# Patient Record
Sex: Male | Born: 1990 | Race: White | Hispanic: No | Marital: Single | Smoking: Current every day smoker
Health system: Southern US, Community
[De-identification: ages and names within clinical notes are randomized; demographics above are authoritative.]

---
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
## 2011-02-11 ENCOUNTER — Ambulatory Visit: Payer: Self-pay | Admitting: Internal Medicine

## 2011-08-18 ENCOUNTER — Ambulatory Visit (INDEPENDENT_AMBULATORY_CARE_PROVIDER_SITE_OTHER): Payer: Managed Care, Other (non HMO) | Admitting: Family Medicine

## 2011-08-18 VITALS — BP 144/82 | HR 61 | Temp 98.1°F | Resp 16 | Ht 70.5 in | Wt 193.4 lb

## 2011-08-18 DIAGNOSIS — G43809 Other migraine, not intractable, without status migrainosus: Secondary | ICD-10-CM

## 2011-08-18 DIAGNOSIS — G44009 Cluster headache syndrome, unspecified, not intractable: Secondary | ICD-10-CM

## 2011-08-18 DIAGNOSIS — J3089 Other allergic rhinitis: Secondary | ICD-10-CM

## 2011-08-18 MED ORDER — PROPRANOLOL HCL ER 60 MG PO CP24: 60.0000 mg | ORAL_CAPSULE | Freq: Every day | ORAL | Status: AC

## 2011-08-18 MED ORDER — CYCLOBENZAPRINE HCL 10 MG PO TABS: 10.0000 mg | ORAL_TABLET | Freq: Every evening | ORAL | Status: AC | PRN

## 2011-08-18 NOTE — Progress Notes (Signed)
  Subjective:    Patient ID: Tommy Gallagher, male    DOB: 1991-01-19, 21 y.o.   MRN: [id number]  HPI  Patient presents complaining of headaches over the past year. He has seen multiple specialists however continues to experience almost daily headaches.  Patient states he wakes up pain free but within 15 minutes after waking up he develops a throbbing sensation behind his (L) eye. Patient also experiences tightness of his neck muscles or pressure across his forehead.  Typically the headaches last between 45 minutes to 3 hours though can reoccur later in the day.  Denies nausea or vomiting. Denies photosensitivity Some phonophobia Sleep always helps  Seen initially by optometrist([facility]) and treated for a stye; later glasses prescribed.  Seen subsequently by opthomologist([facility]) extensive evaluation to include CT of the brain was done. All test were normal.   Obtained second opinion with Dr. Emily Filbert 2 weeks ago and repeat exam was normal. Evaluated by oral surgery and placed on Robaxin and Relafen.    FH/ migraine headaches(Mother)  Has also tried sudafed the last several day.  Mucus more productive of clear sputum  Smoker 1ppd  Review of Systems     Objective:   Physical Exam  Constitutional: He appears well-developed.  HENT:  Right Ear: External ear normal.  Left Ear: External ear normal.  Nose: Nose normal.  Eyes: Conjunctivae and EOM are normal. Pupils are equal, round, and reactive to light.  Neck: Normal range of motion. Neck supple. No thyromegaly present.  Cardiovascular: Normal rate, regular rhythm and normal heart sounds.   Pulmonary/Chest: Effort normal and breath sounds normal.  Abdominal: Soft. Bowel sounds are normal.  Musculoskeletal: Normal range of motion.  Neurological: He is alert. He has normal strength. He displays normal reflexes. No cranial nerve deficit or sensory deficit. He exhibits normal muscle tone.  Reflex Scores:  Bicep reflexes are 2+ on the right side and 2+ on the left side.      Patellar reflexes are 2+ on the right side and 2+ on the left side. Skin: Skin is warm.  Psychiatric: He has a normal mood and affect.          Assessment & Plan:   1. Cluster headaches  cyclobenzaprine (FLEXERIL) 10 MG tablet, propranolol ER (INDERAL LA) 60 MG 24 hr capsule, Ambulatory referral to Neurology   Headache diary Anticipatory guidance

## 2011-08-22 ENCOUNTER — Ambulatory Visit: Payer: Self-pay | Admitting: Internal Medicine

## 2011-08-26 ENCOUNTER — Ambulatory Visit: Payer: Self-pay | Admitting: Family Medicine

## 2011-08-26 DIAGNOSIS — Z0289 Encounter for other administrative examinations: Secondary | ICD-10-CM

## 2011-08-27 ENCOUNTER — Other Ambulatory Visit: Payer: Self-pay | Admitting: Neurology

## 2011-08-27 DIAGNOSIS — R51 Headache: Secondary | ICD-10-CM

## 2011-08-31 ENCOUNTER — Ambulatory Visit
Admission: RE | Admit: 2011-08-31 | Discharge: 2011-08-31 | Disposition: A | Discharge status: Home / Self Care | Payer: Self-pay | Source: Ambulatory Visit | Attending: Neurology | Admitting: Neurology

## 2011-08-31 DIAGNOSIS — R51 Headache: Secondary | ICD-10-CM

## 2011-09-02 ENCOUNTER — Ambulatory Visit: Payer: Self-pay | Admitting: Family Medicine

## 2011-09-30 ENCOUNTER — Other Ambulatory Visit: Payer: Self-pay | Admitting: Neurology

## 2011-09-30 DIAGNOSIS — H02402 Unspecified ptosis of left eyelid: Secondary | ICD-10-CM

## 2011-10-04 ENCOUNTER — Ambulatory Visit
Admission: RE | Admit: 2011-10-04 | Discharge: 2011-10-04 | Disposition: A | Discharge status: Home / Self Care | Payer: Managed Care, Other (non HMO) | Source: Ambulatory Visit | Attending: Neurology | Admitting: Neurology

## 2011-10-04 DIAGNOSIS — H02402 Unspecified ptosis of left eyelid: Secondary | ICD-10-CM

## 2015-07-05 ENCOUNTER — Emergency Department (HOSPITAL_COMMUNITY)
Admission: EM | Admit: 2015-07-05 | Discharge: 2015-07-05 | Disposition: A | Discharge status: Home / Self Care | Payer: Managed Care, Other (non HMO) | Attending: Emergency Medicine | Admitting: Emergency Medicine

## 2015-07-05 ENCOUNTER — Encounter (HOSPITAL_COMMUNITY): Payer: Self-pay | Admitting: Emergency Medicine

## 2015-07-05 DIAGNOSIS — L02414 Cutaneous abscess of left upper limb: Secondary | ICD-10-CM | POA: Insufficient documentation

## 2015-07-05 DIAGNOSIS — F17210 Nicotine dependence, cigarettes, uncomplicated: Secondary | ICD-10-CM | POA: Insufficient documentation

## 2015-07-05 DIAGNOSIS — Z79899 Other long term (current) drug therapy: Secondary | ICD-10-CM | POA: Insufficient documentation

## 2015-07-05 DIAGNOSIS — L0291 Cutaneous abscess, unspecified: Secondary | ICD-10-CM

## 2015-07-05 DIAGNOSIS — Z791 Long term (current) use of non-steroidal anti-inflammatories (NSAID): Secondary | ICD-10-CM | POA: Insufficient documentation

## 2015-07-05 MED ORDER — LIDOCAINE HCL (PF) 1 % IJ SOLN
5.0000 mL | Freq: Once | INTRAMUSCULAR | Status: AC
  Administered 2015-07-05: 5 mL via INTRADERMAL
  Filled 2015-07-05: qty 5

## 2015-07-05 MED ORDER — SULFAMETHOXAZOLE-TRIMETHOPRIM 800-160 MG PO TABS: 1.0000 | ORAL_TABLET | Freq: Two times a day (BID) | ORAL | Status: AC

## 2015-07-05 NOTE — ED Notes (Signed)
Pt. reports reports pain/swelling at left antecubital skin abscess with no drainage  onset this week , pt. stated he has been using it for IV Heroin . Denies fever or chills.

## 2015-07-05 NOTE — Discharge Instructions (Signed)
Take the prescribed medication as directed.  Recommend warm compresses at home to help aid healing.  Return to the ED for new or worsening symptoms.   Abscess An abscess is an infected area that contains a collection of pus and debris.It can occur in almost any part of the body. An abscess is also known as a furuncle or boil. CAUSES  An abscess occurs when tissue gets infected. This can occur from blockage of oil or sweat glands, infection of hair follicles, or a minor injury to the skin. As the body tries to fight the infection, pus collects in the area and creates pressure under the skin. This pressure causes pain. People with weakened immune systems have difficulty fighting infections and get certain abscesses more often.  SYMPTOMS Usually an abscess develops on the skin and becomes a painful mass that is red, warm, and tender. If the abscess forms under the skin, you may feel a moveable soft area under the skin. Some abscesses break open (rupture) on their own, but most will continue to get worse without care. The infection can spread deeper into the body and eventually into the bloodstream, causing you to feel ill.  DIAGNOSIS  Your caregiver will take your medical history and perform a physical exam. A sample of fluid may also be taken from the abscess to determine what is causing your infection. TREATMENT  Your caregiver may prescribe antibiotic medicines to fight the infection. However, taking antibiotics alone usually does not cure an abscess. Your caregiver may need to make a small cut (incision) in the abscess to drain the pus. In some cases, gauze is packed into the abscess to reduce pain and to continue draining the area. HOME CARE INSTRUCTIONS   Only take over-the-counter or prescription medicines for pain, discomfort, or fever as directed by your caregiver.  If you were prescribed antibiotics, take them as directed. Finish them even if you start to feel better.  If gauze is used,  follow your caregiver's directions for changing the gauze.  To avoid spreading the infection:  Keep your draining abscess covered with a bandage.  Wash your hands well.  Do not share personal care items, towels, or whirlpools with others.  Avoid skin contact with others.  Keep your skin and clothes clean around the abscess.  Keep all follow-up appointments as directed by your caregiver. SEEK MEDICAL CARE IF:   You have increased pain, swelling, redness, fluid drainage, or bleeding.  You have muscle aches, chills, or a general ill feeling.  You have a fever. MAKE SURE YOU:   Understand these instructions.  Will watch your condition.  Will get help right away if you are not doing well or get worse.   This information is not intended to replace advice given to you by your health care provider. Make sure you discuss any questions you have with your health care provider.   Document Released: 01/23/2005 Document Revised: 10/15/2011 Document Reviewed: 06/28/2011 Elsevier Interactive Patient Education [employer]2016 [employer]

## 2015-07-05 NOTE — ED Provider Notes (Signed)
CSN: [id number]     Arrival date & time 07/05/15  0536 History   First MD Initiated Contact with Patient 07/05/15 [phone]     Chief Complaint  Patient presents with  . Abscess     (Consider location/radiation/quality/duration/timing/severity/associated sxs/prior Treatment) Patient is a 25 y.o. male presenting with abscess. The history is provided by the patient and medical records.  Abscess   25 year old male here with small abscess to his left [facility] which first appeared approximately 5 days ago after injecting IV heroin. 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 He states area was painful at first, but then seemed to "clear up" but returned again yesterday. He states area is tender to touch. He denies any diffuse pain of his elbow. Denies fever or chills. Patient denies history of HIV, diabetes, or MRSA. He does have a known allergy to Keflex, he gets a rash with this.  History reviewed. No pertinent past medical history. History reviewed. No pertinent past surgical history. No family history on file. Social History  Substance Use Topics  . Smoking status: Current Every Day Smoker -- 0.00 packs/day for 0 years    Types: Cigarettes  . Smokeless tobacco: None  . Alcohol Use: None    Review of Systems  Skin:       abscess  All other systems reviewed and are negative.     Allergies  Review of patient's allergies indicates no known allergies.  Home Medications   Prior to Admission medications   Medication Sig Start Date End Date Taking? Authorizing Provider  methocarbamol (ROBAXIN) 750 MG tablet Take 750 mg by mouth 2 (two) times daily.    Historical Provider, MD  nabumetone (RELAFEN) 750 MG tablet Take 750 mg by mouth 2 (two) times daily.    Historical Provider, MD  propranolol ER (INDERAL LA) 60 MG 24 hr capsule Take 1 capsule (60 mg total) by mouth daily. 08/18/11 08/17/12  Dois Davenport, MD   BP 140/84 mmHg  Pulse 103  Temp(Src) 99.1 F  (37.3 C) (Oral)  Resp 16  Ht 6' (1.829 m)  Wt 87.091 kg  BMI 26.03 kg/m2  SpO2 98%   Physical Exam  Constitutional: He is oriented to person, place, and time. He appears well-developed and well-nourished. No distress.  HENT:  Head: Normocephalic and atraumatic.  Mouth/Throat: Oropharynx is clear and moist.  Eyes: Conjunctivae and EOM are normal. Pupils are equal, round, and reactive to light.  Neck: Normal range of motion. Neck supple.  Cardiovascular: Normal rate, regular rhythm and normal heart sounds.   Pulmonary/Chest: Effort normal and breath sounds normal. No respiratory distress. He has no wheezes.  Abdominal: Soft. Bowel sounds are normal. There is no tenderness. There is no guarding.  Musculoskeletal: Normal range of motion.  Small, 2cm in diameter abscess of left AC; area is firm without fluctuance; no surrounding induration or cellulitis; full ROM of left elbow; normal strength and sensation throughout left arm  Neurological: He is alert and oriented to person, place, and time.  Skin: Skin is warm and dry. He is not diaphoretic.  Psychiatric: He has a normal mood and affect.  Nursing note and vitals reviewed.   ED Course  Procedures (including critical care time) Labs Review Labs Reviewed - No data to display  Imaging Review No results found. I have personally reviewed and evaluated these images and lab results as part of my medical decision-making.   EKG Interpretation None  MDM   Final diagnoses:  Abscess   25 year old male here with abscess of his left AC after injecting heroin earlier this week.  Patient is afebrile, nontoxic. Abscess is small and firm to touch without fluctuance or drainage and does not appear amenable to I&D at this time. There are no clinical signs or symptoms concerning for septic joint at this time. Will start on bactrim.  Encouraged warm compresses at home.  Discussed plan with patient, he/she acknowledged understanding and agreed  with plan of care.  Return precautions given for new or worsening symptoms.  Garlon HatchetLisa M Lulia Schriner, PA-C 07/05/15 [id number]  Rolland PorterMark James, MD 07/12/15 1500

## 2016-01-09 ENCOUNTER — Ambulatory Visit: Payer: Managed Care, Other (non HMO)

## 2016-01-13 ENCOUNTER — Ambulatory Visit: Payer: Managed Care, Other (non HMO)

## 2016-01-15 ENCOUNTER — Ambulatory Visit: Payer: Managed Care, Other (non HMO)

## 2018-01-31 ENCOUNTER — Encounter (HOSPITAL_BASED_OUTPATIENT_CLINIC_OR_DEPARTMENT_OTHER): Payer: Self-pay | Admitting: *Deleted

## 2018-01-31 ENCOUNTER — Other Ambulatory Visit: Payer: Self-pay

## 2018-01-31 ENCOUNTER — Emergency Department (HOSPITAL_BASED_OUTPATIENT_CLINIC_OR_DEPARTMENT_OTHER)
Admission: EM | Admit: 2018-01-31 | Discharge: 2018-02-01 | Disposition: A | Discharge status: Home / Self Care | Payer: Self-pay | Attending: Emergency Medicine | Admitting: Emergency Medicine

## 2018-01-31 DIAGNOSIS — Y999 Unspecified external cause status: Secondary | ICD-10-CM | POA: Insufficient documentation

## 2018-01-31 DIAGNOSIS — F17210 Nicotine dependence, cigarettes, uncomplicated: Secondary | ICD-10-CM | POA: Insufficient documentation

## 2018-01-31 DIAGNOSIS — W182XXA Fall in (into) shower or empty bathtub, initial encounter: Secondary | ICD-10-CM | POA: Insufficient documentation

## 2018-01-31 DIAGNOSIS — Y93E1 Activity, personal bathing and showering: Secondary | ICD-10-CM | POA: Insufficient documentation

## 2018-01-31 DIAGNOSIS — Z23 Encounter for immunization: Secondary | ICD-10-CM | POA: Insufficient documentation

## 2018-01-31 DIAGNOSIS — Y92002 Bathroom of unspecified non-institutional (private) residence single-family (private) house as the place of occurrence of the external cause: Secondary | ICD-10-CM | POA: Insufficient documentation

## 2018-01-31 DIAGNOSIS — S01121A Laceration with foreign body of right eyelid and periocular area, initial encounter: Secondary | ICD-10-CM | POA: Insufficient documentation

## 2018-01-31 DIAGNOSIS — S0181XA Laceration without foreign body of other part of head, initial encounter: Secondary | ICD-10-CM

## 2018-01-31 DIAGNOSIS — Z79899 Other long term (current) drug therapy: Secondary | ICD-10-CM | POA: Insufficient documentation

## 2018-01-31 NOTE — ED Triage Notes (Signed)
Pt reports he fell in [place] and hit his head on corner of counter. Denies LOC. Large lac present to forehead over right eyebrow

## 2018-02-01 ENCOUNTER — Emergency Department (HOSPITAL_BASED_OUTPATIENT_CLINIC_OR_DEPARTMENT_OTHER): Payer: Self-pay

## 2018-02-01 MED ORDER — TETANUS-DIPHTH-ACELL PERTUSSIS 5-2.5-18.5 LF-MCG/0.5 IM SUSP
0.5000 mL | Freq: Once | INTRAMUSCULAR | Status: AC
  Administered 2018-02-01: 0.5 mL via INTRAMUSCULAR
  Filled 2018-02-01: qty 0.5

## 2018-02-01 MED ORDER — LIDOCAINE-EPINEPHRINE (PF) 1 %-1:200000 IJ SOLN
INTRAMUSCULAR | Status: AC
  Administered 2018-02-01: 10 mL
  Filled 2018-02-01: qty 10

## 2018-02-01 MED ORDER — LIDOCAINE-EPINEPHRINE (PF) 2 %-1:200000 IJ SOLN
20.0000 mL | Freq: Once | INTRAMUSCULAR | Status: DC
  Filled 2018-02-01: qty 20

## 2018-02-01 NOTE — ED Provider Notes (Signed)
[facility] EMERGENCY DEPARTMENT Provider Note   CSN: [id number] Arrival date & time: 01/31/18  2324     History   Chief Complaint Chief Complaint  Patient presents with  . Head Injury    HPI Tommy Gallagher is a 27 y.o. male.  HPI  This is a 27 year old male who presents with a facial laceration.  Patient reports that he was getting out of the bathtub and slipped hitting his forehead on the sink.  He did not lose consciousness.  Denies any nausea or vomiting.  This happened at 9 PM.  He has been ambulatory.  He denies pain elsewhere.  Denies any headache or vision changes.  Unknown last tetanus shot.  Patient does report drug use tonight.  He states he used heroin.  History reviewed. No pertinent past medical history.  There are no active problems to display for this patient.   History reviewed. No pertinent surgical history.      Home Medications    Prior to Admission medications   Medication Sig Start Date End Date Taking? Authorizing Provider  methocarbamol (ROBAXIN) 750 MG tablet Take 750 mg by mouth 2 (two) times daily.    [provider]  nabumetone (RELAFEN) 750 MG tablet Take 750 mg by mouth 2 (two) times daily.    [provider]  propranolol ER (INDERAL LA) 60 MG 24 hr capsule Take 1 capsule (60 mg total) by mouth daily. 08/18/11 08/17/12  Dois Davenport, MD    Family History No family history on file.  Social History Social History   Tobacco Use  . Smoking status: Current Every Day Smoker    Packs/day: 0.00    Years: 0.00    Pack years: 0.00    Types: Cigarettes  . Smokeless tobacco: Never Used  Substance Use Topics  . Alcohol use: Not on file    Comment: occasional  . Drug use: Yes    Types: IV    Comment: heroin     Allergies   Patient has no known allergies.   Review of Systems Review of Systems  Gastrointestinal: Negative for nausea and vomiting.  Musculoskeletal: Negative for neck pain.  Skin:  Positive for wound.  Neurological: Negative for syncope and headaches.  All other systems reviewed and are negative.    Physical Exam Updated Vital Signs BP 117/75 (BP Location: Right Arm)   Pulse 73   Temp 98.4 F (36.9 C) (Oral)   Resp 19   Ht 1.829 m (6')   Wt 83.9 kg   SpO2 100%   BMI 25.09 kg/m   Physical Exam  Constitutional: He is oriented to person, place, and time. He appears well-developed and well-nourished.  Appears high  HENT:  Head: Normocephalic and atraumatic.    T shaped laceration just above the right eyebrow, deep, not bleeding, no involvement of muscle, approximately 6 cm x 2 cm  Eyes: Pupils are equal, round, and reactive to light. EOM are normal.  Pupils 3 mm reactive bilaterally  Neck: Normal range of motion. Neck supple.  No midline C-spine tenderness palpation, step-off, deformity  Cardiovascular: Normal rate, regular rhythm and normal heart sounds.  No murmur heard. Pulmonary/Chest: Effort normal and breath sounds normal. No respiratory distress. He has no wheezes.  Musculoskeletal: He exhibits no edema.  Neurological: He is alert and oriented to person, place, and time.  Skin: Skin is warm and dry.  Psychiatric: He has a normal mood and affect.  Nursing note and vitals reviewed.  ED Treatments / Results  Labs (all labs ordered are listed, but only abnormal results are displayed) Labs Reviewed - No data to display  EKG None  Radiology Ct Head Wo Contrast  Result Date: 02/01/2018 CLINICAL DATA:  Pain after trauma EXAM: CT HEAD WITHOUT CONTRAST CT MAXILLOFACIAL WITHOUT CONTRAST TECHNIQUE: Multidetector CT imaging of the head and maxillofacial structures were performed using the standard protocol without intravenous contrast. Multiplanar CT image reconstructions of the maxillofacial structures were also generated. COMPARISON:  None. FINDINGS: CT HEAD FINDINGS Brain: No evidence of acute infarction, hemorrhage, hydrocephalus, extra-axial  collection or mass lesion/mass effect. Vascular: No hyperdense vessel or unexpected calcification. Skull: Normal. Negative for fracture or focal lesion. Other: Soft tissue swelling over the right forehead and supraorbital region. The globes are intact. Extracranial soft tissues otherwise normal. CT MAXILLOFACIAL FINDINGS Osseous: No fracture or mandibular dislocation. No destructive process. Orbits: Negative. No traumatic or inflammatory finding. Sinuses: Mucosal thickening in the left maxillary sinus. Paranasal sinuses, mastoid air cells, and middle ears are otherwise normal. Soft tissues: Soft tissue swelling is seen over the right forehead and supraorbital region. The underlying globe is intact. No other soft tissue abnormalities are identified. IMPRESSION: 1. Soft tissue swelling over the right forehead and supraorbital region. 2. No acute intracranial abnormalities. 3. No facial bone fractures. Electronically Signed   By: Gerome Sam III M.D   On: 02/01/2018 02:02   Ct Maxillofacial Wo Contrast  Result Date: 02/01/2018 CLINICAL DATA:  Pain after trauma EXAM: CT HEAD WITHOUT CONTRAST CT MAXILLOFACIAL WITHOUT CONTRAST TECHNIQUE: Multidetector CT imaging of the head and maxillofacial structures were performed using the standard protocol without intravenous contrast. Multiplanar CT image reconstructions of the maxillofacial structures were also generated. COMPARISON:  None. FINDINGS: CT HEAD FINDINGS Brain: No evidence of acute infarction, hemorrhage, hydrocephalus, extra-axial collection or mass lesion/mass effect. Vascular: No hyperdense vessel or unexpected calcification. Skull: Normal. Negative for fracture or focal lesion. Other: Soft tissue swelling over the right forehead and supraorbital region. The globes are intact. Extracranial soft tissues otherwise normal. CT MAXILLOFACIAL FINDINGS Osseous: No fracture or mandibular dislocation. No destructive process. Orbits: Negative. No traumatic or  inflammatory finding. Sinuses: Mucosal thickening in the left maxillary sinus. Paranasal sinuses, mastoid air cells, and middle ears are otherwise normal. Soft tissues: Soft tissue swelling is seen over the right forehead and supraorbital region. The underlying globe is intact. No other soft tissue abnormalities are identified. IMPRESSION: 1. Soft tissue swelling over the right forehead and supraorbital region. 2. No acute intracranial abnormalities. 3. No facial bone fractures. Electronically Signed   By: Gerome Sam III M.D   On: 02/01/2018 02:02    Procedures .Marland KitchenLaceration Repair Date/Time: 02/01/2018 2:38 AM Performed by: Shon Baton, MD Authorized by: Shon Baton, MD   Consent:    Consent obtained:  Verbal   Consent given by:  Patient   Risks discussed:  Infection, pain and poor cosmetic result   Alternatives discussed:  No treatment Anesthesia (see MAR for exact dosages):    Anesthesia method:  Local infiltration   Local anesthetic:  Lidocaine 2% WITH epi Laceration details:    Location:  Face   Face location:  Forehead   Length (cm):  8   Depth (mm):  8 Repair type:    Repair type:  Intermediate Pre-procedure details:    Preparation:  Patient was prepped and draped in usual sterile fashion Exploration:    Wound exploration: wound explored through full range of motion and entire depth  of wound probed and visualized     Wound extent: no areolar tissue violation noted, no fascia violation noted, no foreign bodies/material noted, no muscle damage noted, no nerve damage noted and no tendon damage noted     Contaminated: no   Treatment:    Area cleansed with:  Betadine   Amount of cleaning:  Standard   Irrigation solution:  Sterile saline   Irrigation volume:  250   Irrigation method:  Pressure wash   Visualized foreign bodies/material removed: no   Mucous membrane repair:    Suture size:  4-0   Suture material:  Vicryl   Suture technique:  Simple  interrupted   Number of sutures:  2 Skin repair:    Repair method:  Sutures   Suture size:  5-0   Suture material:  Fast-absorbing gut   Suture technique:  Simple interrupted   Number of sutures:  12 Approximation:    Approximation:  Close Post-procedure details:    Dressing:  Antibiotic ointment   Patient tolerance of procedure:  Tolerated well, no immediate complications   (including critical care time)  Medications Ordered in ED Medications  lidocaine-EPINEPHrine (XYLOCAINE W/EPI) 2 %-1:200000 (PF) injection 20 mL (has no administration in time range)  Tdap (BOOSTRIX) injection 0.5 mL (0.5 mLs Intramuscular Given 02/01/18 0148)  lidocaine-EPINEPHrine (XYLOCAINE-EPINEPHrine) 1 %-1:200000 (PF) injection (10 mLs  Given by Other 02/01/18 0150)     Initial Impression / Assessment and Plan / ED Course  I have reviewed the triage vital signs and the nursing notes.  Pertinent labs & imaging results that were available during my care of the patient were reviewed by me and considered in my medical decision making (see chart for details).     Patient presents with a laceration to the face.  Reports mechanical fall.  Also reports drug use tonight and appears high.  Denies any other symptoms.  Given acute drug use, CT scan of the head and face were obtained as I do not feel I can clinically clear him given his mental status.  CT scan is negative for acute fracture or head bleed.  Laceration was repaired without difficulty.  The procedure note.  Tetanus was updated.  After history, exam, and medical workup I feel the patient has been appropriately medically screened and is safe for discharge home. Pertinent diagnoses were discussed with the patient. Patient was given return precautions.   Final Clinical Impressions(s) / ED Diagnoses   Final diagnoses:  Facial laceration, initial encounter    ED Discharge Orders    None       Shon Baton, MD 02/01/18 [phone]

## 2018-02-01 NOTE — Discharge Instructions (Addendum)
You were seen today for a fall.  You sustained a laceration to your face.  This was repaired with absorbable sutures.  This should heal on its own.  Once healed, apply moisturizer and sunscreen to minimize scarring.

## 2018-02-17 ENCOUNTER — Emergency Department (HOSPITAL_BASED_OUTPATIENT_CLINIC_OR_DEPARTMENT_OTHER)
Admission: EM | Admit: 2018-02-17 | Discharge: 2018-02-17 | Disposition: A | Discharge status: Home / Self Care | Payer: Self-pay | Attending: Emergency Medicine | Admitting: Emergency Medicine

## 2018-02-17 ENCOUNTER — Other Ambulatory Visit: Payer: Self-pay

## 2018-02-17 ENCOUNTER — Encounter (HOSPITAL_BASED_OUTPATIENT_CLINIC_OR_DEPARTMENT_OTHER): Payer: Self-pay | Admitting: *Deleted

## 2018-02-17 DIAGNOSIS — F17210 Nicotine dependence, cigarettes, uncomplicated: Secondary | ICD-10-CM | POA: Insufficient documentation

## 2018-02-17 DIAGNOSIS — R220 Localized swelling, mass and lump, head: Secondary | ICD-10-CM | POA: Insufficient documentation

## 2018-02-17 DIAGNOSIS — F1920 Other psychoactive substance dependence, uncomplicated: Secondary | ICD-10-CM | POA: Insufficient documentation

## 2018-02-17 DIAGNOSIS — L03211 Cellulitis of face: Secondary | ICD-10-CM | POA: Insufficient documentation

## 2018-02-17 MED ORDER — SULFAMETHOXAZOLE-TRIMETHOPRIM 800-160 MG PO TABS: 1.0000 | ORAL_TABLET | Freq: Two times a day (BID) | ORAL | 0 refills | Status: AC

## 2018-02-17 MED ORDER — IBUPROFEN 400 MG PO TABS
600.0000 mg | ORAL_TABLET | Freq: Once | ORAL | Status: AC
  Administered 2018-02-17: 600 mg via ORAL
  Filled 2018-02-17: qty 1

## 2018-02-17 NOTE — ED Provider Notes (Signed)
[facility] EMERGENCY DEPARTMENT Provider Note   CSN: [id number] Arrival date & time: 02/17/18  1358     History   Chief Complaint Chief Complaint  Patient presents with  . Abscess    HPI Tommy Gallagher is a 27 y.o. male.  27 year old male, no significant medical history, presents with a 3-day history of wound to his left upper lip.  Patient states he had an ingrown hair and tried to pop it.  He states since then he has had increased swelling and redness.  He denies any active discharge.  He notes a subjective fever last night.  He denies any difficulty swallowing, difficulty breathing, neck pain or neck stiffness.  Patient denies a history of abscess in the same place.   The history is provided by the patient.  Abscess  Location:  Face Facial abscess location:  Upper lip Abscess quality: induration and redness   Progression:  Worsening Associated symptoms: fever (subjective)   Associated symptoms: no nausea and no vomiting     History reviewed. No pertinent past medical history.  There are no active problems to display for this patient.   History reviewed. No pertinent surgical history.      Home Medications    Prior to Admission medications   Medication Sig Start Date End Date Taking? Authorizing Provider  methocarbamol (ROBAXIN) 750 MG tablet Take 750 mg by mouth 2 (two) times daily.    [provider]  nabumetone (RELAFEN) 750 MG tablet Take 750 mg by mouth 2 (two) times daily.    [provider]  propranolol ER (INDERAL LA) 60 MG 24 hr capsule Take 1 capsule (60 mg total) by mouth daily. 08/18/11 08/17/12  Dois Davenport, MD    Family History No family history on file.  Social History Social History   Tobacco Use  . Smoking status: Current Every Day Smoker    Packs/day: 0.00    Years: 0.00    Pack years: 0.00    Types: Cigarettes  . Smokeless tobacco: Never Used  Substance Use Topics  . Alcohol use: Not on file   Comment: occasional  . Drug use: Yes    Types: IV    Comment: heroin     Allergies   Patient has no known allergies.   Review of Systems Review of Systems  Constitutional: Positive for fever (subjective). Negative for chills.  HENT: Negative for dental problem, ear pain, mouth sores and rhinorrhea.   Eyes: Negative for visual disturbance.  Respiratory: Negative for cough, shortness of breath and stridor.   Cardiovascular: Negative for chest pain.  Gastrointestinal: Negative for abdominal pain, nausea and vomiting.  Genitourinary: Negative for dysuria.  Skin: Positive for wound (left upper lip).     Physical Exam Updated Vital Signs BP 117/77   Pulse 70   Temp 99.7 F (37.6 C) (Oral)   Resp 18   Ht 6' (1.829 m)   Wt 83.9 kg   SpO2 99%   BMI 25.09 kg/m   Physical Exam  Constitutional: He is oriented to person, place, and time. He appears well-developed and well-nourished.  HENT:  Head: Normocephalic and atraumatic.    Right Ear: Tympanic membrane normal.  Left Ear: Tympanic membrane normal.  Nose: Nose normal.  Mouth/Throat: Uvula is midline, oropharynx is clear and moist and mucous membranes are normal.  No evidence of Ludwig's angina, no trismus, no drooling.  No peritonsillar abscess, retropharyngeal abscess.  Patient able to talk in full sentences without difficulty.  No stridor.  Eyes: Conjunctivae and EOM are normal.  Neck: Neck supple.  Cardiovascular: Normal rate, regular rhythm and normal heart sounds.  No murmur heard. Pulmonary/Chest: Effort normal and breath sounds normal. No respiratory distress. He has no wheezes. He has no rales.  Abdominal: Soft. Bowel sounds are normal. He exhibits no distension. There is no tenderness.  Musculoskeletal: Normal range of motion. He exhibits no tenderness or deformity.  Neurological: He is alert and oriented to person, place, and time.  Skin: Skin is warm and dry. No rash noted. No erythema.  Psychiatric: He has a  normal mood and affect. His behavior is normal.  Nursing note and vitals reviewed.    ED Treatments / Results  Labs (all labs ordered are listed, but only abnormal results are displayed) Labs Reviewed - No data to display  EKG None  Radiology No results found.  Procedures Procedures (including critical care time)  Medications Ordered in ED Medications - No data to display   Initial Impression / Assessment and Plan / ED Course  I have reviewed the triage vital signs and the nursing notes.  Pertinent labs & imaging results that were available during my care of the patient were reviewed by me and considered in my medical decision making (see chart for details).     Patient seen and evaluated by Dr. Rush Landmark.  He agrees no evidence of Ludwig's angina, no trismus.  Agrees no definable abscess to drain at this time.  Recommends antibiotics for cellulitis and warm compresses.  Given strict return precautions patient.  Patient afebrile, not tachycardic.  Patient resting complaint bed, no acute distress, nontoxic, non-lethargic.   At this time there does not appear to be any evidence of an acute emergency medical condition and the patient appears stable for discharge with appropriate outpatient follow up. Diagnosis was discussed with patient who verbalizes understanding and is agreeable to discharge.   Final Clinical Impressions(s) / ED Diagnoses   Final diagnoses:  None    ED Discharge Orders    None       Rueben Bash 02/17/18 2107    Tegeler, Canary Brim, MD 02/18/18 1451

## 2018-02-17 NOTE — ED Triage Notes (Signed)
Skin infection above his lip of the left side of his face. States he feels he has an ingrown hair. Hx of abscess in the past.

## 2018-02-17 NOTE — Discharge Instructions (Signed)
Apply warm compresses to the upper lip 3-4 times a day.  Return to the ED immediately for new or worsening symptoms, such as fevers, worsening redness, occult he swallowing difficulty breathing or any concerns.

## 2019-03-10 ENCOUNTER — Other Ambulatory Visit: Payer: Self-pay

## 2019-03-10 DIAGNOSIS — F17210 Nicotine dependence, cigarettes, uncomplicated: Secondary | ICD-10-CM | POA: Insufficient documentation

## 2019-03-10 DIAGNOSIS — L02414 Cutaneous abscess of left upper limb: Secondary | ICD-10-CM | POA: Insufficient documentation

## 2019-03-10 DIAGNOSIS — F1110 Opioid abuse, uncomplicated: Secondary | ICD-10-CM | POA: Insufficient documentation

## 2019-03-10 DIAGNOSIS — F1910 Other psychoactive substance abuse, uncomplicated: Secondary | ICD-10-CM | POA: Insufficient documentation

## 2019-03-11 ENCOUNTER — Emergency Department (HOSPITAL_COMMUNITY)
Admission: EM | Admit: 2019-03-11 | Discharge: 2019-03-11 | Disposition: A | Discharge status: Home / Self Care | Payer: Self-pay | Attending: Emergency Medicine | Admitting: Emergency Medicine

## 2019-03-11 ENCOUNTER — Encounter (HOSPITAL_COMMUNITY): Payer: Self-pay

## 2019-03-11 ENCOUNTER — Other Ambulatory Visit: Payer: Self-pay

## 2019-03-11 DIAGNOSIS — L02414 Cutaneous abscess of left upper limb: Secondary | ICD-10-CM

## 2019-03-11 MED ORDER — CLINDAMYCIN HCL 300 MG PO CAPS: 300.0000 mg | ORAL_CAPSULE | Freq: Four times a day (QID) | ORAL | 0 refills | Status: AC

## 2019-03-11 MED ORDER — CLINDAMYCIN HCL 300 MG PO CAPS
300.0000 mg | ORAL_CAPSULE | Freq: Once | ORAL | Status: AC
  Administered 2019-03-11: 01:00:00 300 mg via ORAL
  Filled 2019-03-11: qty 1

## 2019-03-11 MED ORDER — LIDOCAINE-EPINEPHRINE 2 %-1:100000 IJ SOLN
20.0000 mL | Freq: Once | INTRAMUSCULAR | Status: AC
  Administered 2019-03-11: 01:00:00 1 mL
  Filled 2019-03-11: qty 1

## 2019-03-11 NOTE — ED Triage Notes (Addendum)
Pt coming from home c/o abscess to left deltoid after injecting heroin in it a week ago. Area is red and tender to touch. Pain 6/10

## 2019-03-11 NOTE — ED Provider Notes (Signed)
[facility]-EMERGENCY DEPT Provider Note: Lowella Dell, MD, FACEP  CSN: [id number] MRN: [id number] ARRIVAL: 03/10/19 at 2346 ROOM: WA22/WA22   CHIEF COMPLAINT  Abscess   HISTORY OF PRESENT ILLNESS  03/11/19 12:30 AM Blanchard Mane Oyama is a 28 y.o. male with a history of heroin abuse.  He is here with a 4-day history of a tender, fluctuant, erythematous area of his left deltoid where he states he accidentally injected heroin into his muscle.  He rates associated pain is 6 out of 10, worse with movement or palpation.  He denies fever.  There is been no drainage.   History reviewed. No pertinent past medical history.  History reviewed. No pertinent surgical history.  No family history on file.  Social History   Tobacco Use  . Smoking status: Current Every Day Smoker    Packs/day: 0.00    Years: 0.00    Pack years: 0.00    Types: Cigarettes  . Smokeless tobacco: Never Used  Substance Use Topics  . Alcohol use: Not on file    Comment: occasional  . Drug use: Yes    Types: IV    Comment: heroin    Prior to Admission medications   Medication Sig Start Date End Date Taking? Authorizing Provider  clindamycin (CLEOCIN) 300 MG capsule Take 1 capsule (300 mg total) by mouth 4 (four) times daily. X 7 days 03/11/19   Nekeisha Aure, MD  propranolol ER (INDERAL LA) 60 MG 24 hr capsule Take 1 capsule (60 mg total) by mouth daily. 08/18/11 08/17/12  Dois Davenport, MD    Allergies Keflex [cephalexin]   REVIEW OF SYSTEMS  Negative except as noted here or in the History of Present Illness.   PHYSICAL EXAMINATION  Initial Vital Signs Blood pressure 99/70, pulse 93, temperature 98.1 F (36.7 C), temperature source Oral, resp. rate 16, height 6' (1.829 m), weight 90.7 kg, SpO2 100 %.  Examination General: Well-developed, well-nourished male in no acute distress; appearance consistent with age of record HENT: normocephalic; atraumatic Eyes: pupils equal, round and reactive to light;  extraocular muscles intact Neck: supple Heart: regular rate and rhythm Lungs: clear to auscultation bilaterally Abdomen: soft; nondistended; nontender; bowel sounds present Extremities: No deformity; full range of motion Neurologic: Awake, alert and oriented; motor function intact in all extremities and symmetric; no facial droop Skin: Warm and dry; multiple track marks of antecubital fossae; tender, fluctuant mass left deltoid consistent with abscess:    Psychiatric: Normal mood and affect   RESULTS  Summary of this visit's results, reviewed and interpreted by myself:   EKG Interpretation  Date/Time:    Ventricular Rate:    PR Interval:    QRS Duration:   QT Interval:    QTC Calculation:   R Axis:     Text Interpretation:        Laboratory Studies: No results found for this or any previous visit (from the past 24 hour(s)). Imaging Studies: No results found.  ED COURSE and MDM  Nursing notes, initial and subsequent vitals signs, including pulse oximetry, reviewed and interpreted by myself.  Vitals:   03/11/19 0010 03/11/19 0011 03/11/19 0048  BP: 99/70  126/67  Pulse: 93  86  Resp: 16  16  Temp: 98.1 F (36.7 C)    TempSrc: Oral    SpO2: 100%  98%  Weight:  90.7 kg   Height:  6' (1.829 m)    Medications  clindamycin (CLEOCIN) capsule 300 mg (has no administration in time  range)  lidocaine-EPINEPHrine (XYLOCAINE W/EPI) 2 %-1:100000 (with pres) injection 20 mL (1 mL Infiltration Given 03/11/19 0056)    We will start patient on clindamycin as the pus was foul-smelling suggesting anaerobes likely admixed with gram-positive cocci.  Patient advised to remove packing in 3 days if symptoms are improving or to return for recheck if symptoms are worsening.  PROCEDURES  Procedures  INCISION AND DRAINAGE Performed by: Karen Chafe Skarlet Lyons Consent: Verbal consent obtained. Risks and benefits: risks, benefits and alternatives were discussed Type: abscess  Body area: Left  deltoid  Anesthesia: local infiltration  Incision was made with a scalpel.  Local anesthetic: lidocaine 2% with epinephrine  Anesthetic total: 5 ml  Complexity: complex Blunt dissection to break up loculations  Drainage: purulent  Drainage amount: Copious, foul-smelling  Packing material: 1/4 in iodoform gauze  Patient tolerance: Patient tolerated the procedure well with no immediate complications.   ED DIAGNOSES     ICD-10-CM   1. Abscess of left arm  L02.414        Ibraham Levi, Jenny Reichmann, MD 03/11/19 [phone]

## 2019-03-13 LAB — AEROBIC CULTURE W GRAM STAIN (SUPERFICIAL SPECIMEN)

## 2019-03-14 ENCOUNTER — Telehealth: Payer: Self-pay

## 2019-03-14 NOTE — Telephone Encounter (Signed)
Post ED Visit - Positive Culture Follow-up: Unsuccessful Patient Follow-up  Culture assessed and recommendations reviewed by:  []  Elenor Quinones, Pharm.D. []  Heide Guile, Pharm.D., BCPS AQ-ID []  Parks Neptune, Pharm.D., BCPS []  Alycia Rossetti, Pharm.D., BCPS []  Victor, Pharm.D., BCPS, AAHIVP []  Legrand Como, Pharm.D., BCPS, AAHIVP []  Wynell Balloon, PharmD []  Vincenza Hews, PharmD, BCPS Leticia Clas Pharm D Positive aerobic culture  []  Patient discharged without antimicrobial prescription and treatment is now indicated [x]  Organism is resistant to prescribed ED discharge antimicrobial []  Patient with positive blood cultures  Needs Augmentin 875 mg BID x 7 days or Moxifloxacin 400 mg daily x 7 days if refuses Augmentin Unable to contact patient after 3 attempts, letter will be sent to address on file  Genia Del 03/14/2019, 12:21 PM

## 2019-03-14 NOTE — Progress Notes (Signed)
ED Antimicrobial Stewardship Positive Culture Follow Up   Tommy Gallagher is an 28 y.o. male who presented to [facility] on 03/11/2019 with a chief complaint of  Chief Complaint  Patient presents with  . Abscess    Recent Results (from the past 720 hour(s))  Aerobic Culture (superficial specimen)     Status: None   Collection Time: 03/11/19 12:57 AM   Specimen: Arm  Result Value Ref Range Status   Specimen Description   Final    ARM Performed at [facility] [address], [place], [place] [address]    Special Requests   Final    NONE Performed at [facility], [place] [address], [place], [place] [address]    Gram Stain   Final    ABUNDANT WBC PRESENT, PREDOMINANTLY PMN ABUNDANT GRAM POSITIVE COCCI FEW GRAM NEGATIVE RODS FEW GRAM POSITIVE RODS    Culture   Final    MODERATE VIRIDANS STREPTOCOCCUS MODERATE EIKENELLA CORRODENS Usually susceptible to penicillin and other beta lactam agents,quinolones,macrolides and tetracyclines. Performed at [facility], [place] [address], [place], [place] [address]    Report Status 03/13/2019 FINAL  Final    [x]  Treated with Clindamycin, organism resistant to prescribed antimicrobial May or may not need additional treatment depending on success of I&D   RN to contact patient to check for improvement in symptoms.   If no or minimal improvement, switch clindamycin to Augmentin 875 mg PO bid x 7 days  Patient does report rash with Keflex. While Augmentin is likely safe, if patient refuses d/t concern for allergy, may use Moxifloxacin 400 mg PO daily x 7 days in place of Augmentin  ED Provider: Christ Kick, MD   [name], PharmD, BCPS [phone] 03/14/2019, 12:05 PM

## 2019-09-05 IMAGING — CT CT MAXILLOFACIAL W/O CM
4 of 7 series · 16 of 47 positions shown, 19 images · non-contrast
Comparison: None.

CLINICAL DATA: Pain after trauma

EXAM:
CT HEAD WITHOUT CONTRAST
CT MAXILLOFACIAL WITHOUT CONTRAST
TECHNIQUE: Multidetector CT imaging of the head and maxillofacial structures
were performed using the standard protocol without intravenous
contrast. Multiplanar CT image reconstructions of the maxillofacial
structures were also generated.

[Series 3: head bone · axial · 0.45mm/px · z∈[+814,+952]mm · 10 of 85 slices shown, 13 images]
[im 8/85  brain]
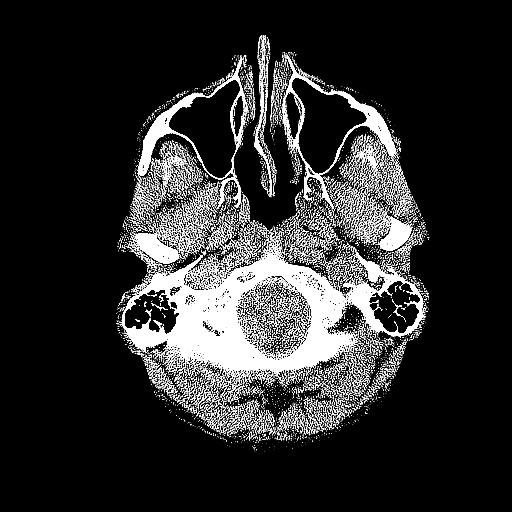
[im 8/85  bone]
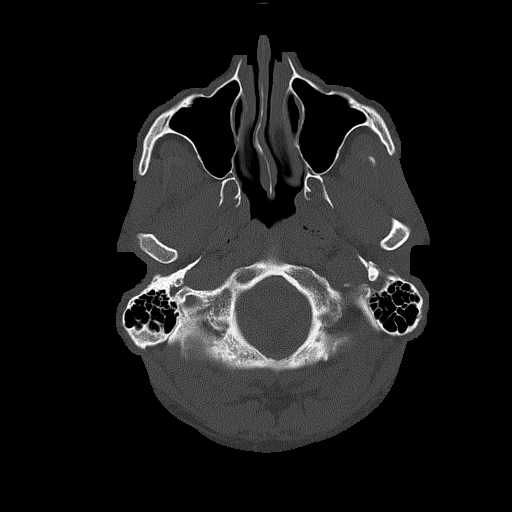
[im 16/85  bone]
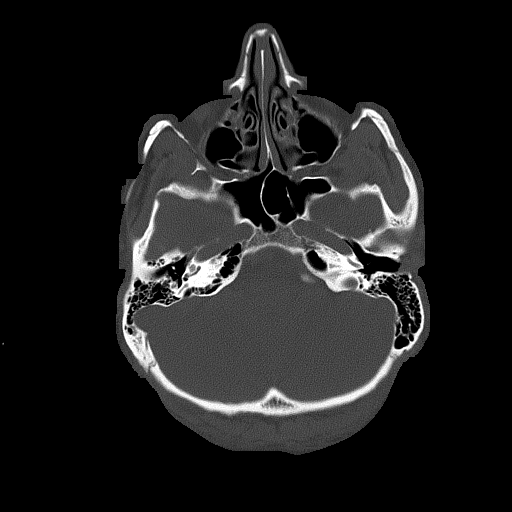
[im 23/85  bone]
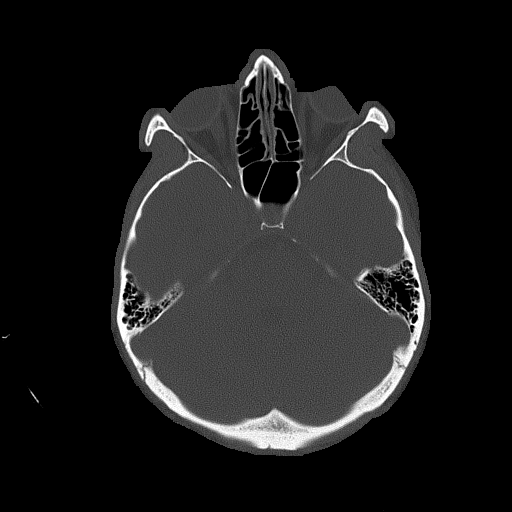
[im 31/85  bone]
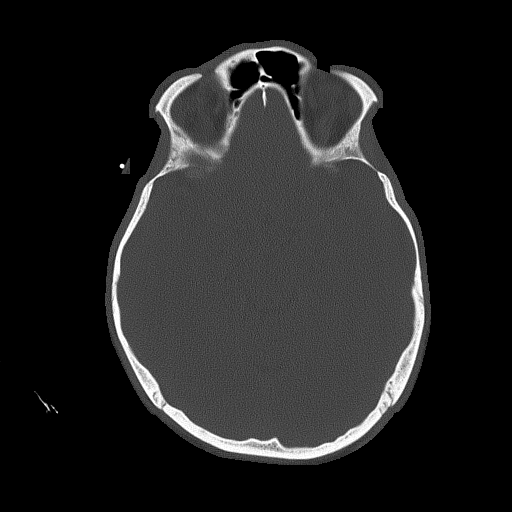
[im 39/85  brain]
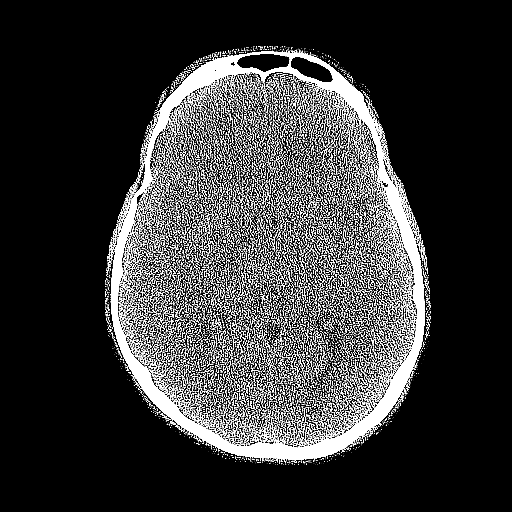
[im 39/85  bone]
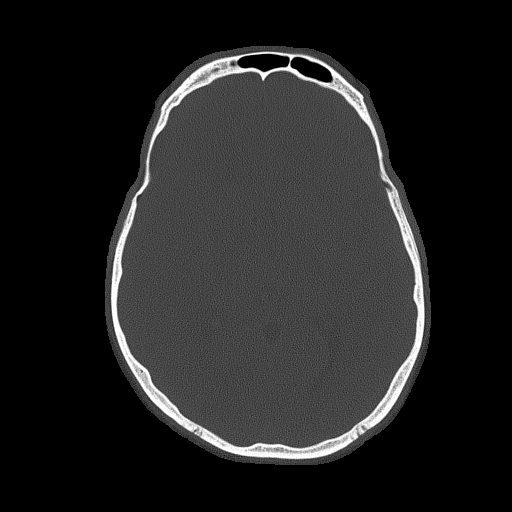
[im 46/85  bone]
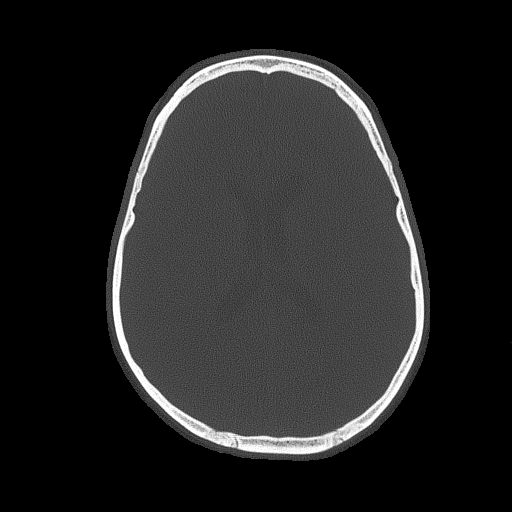
[im 54/85  bone]
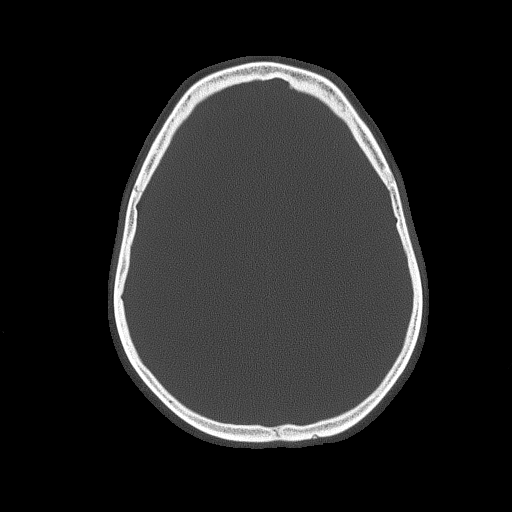
[im 62/85  bone]
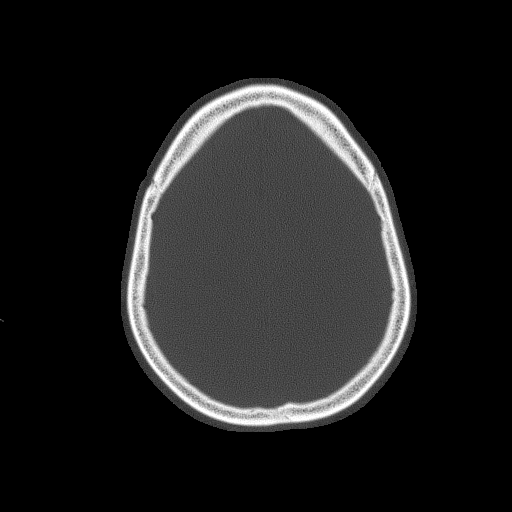
[im 69/85  brain]
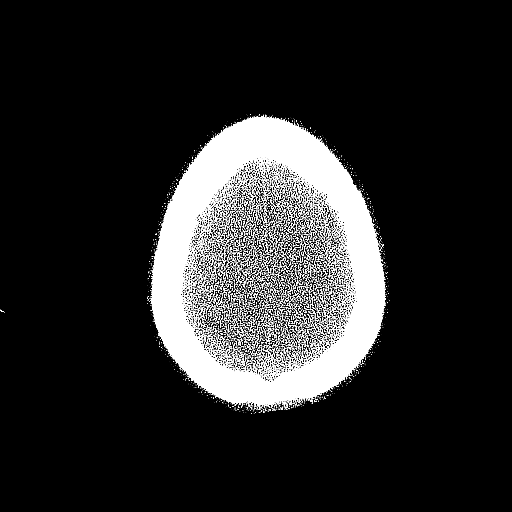
[im 69/85  bone]
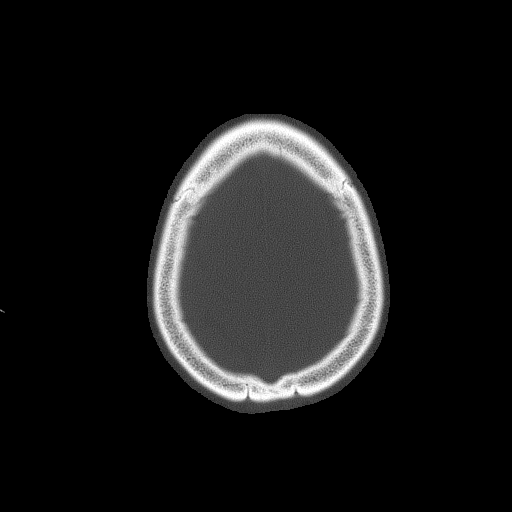
[im 77/85  bone]
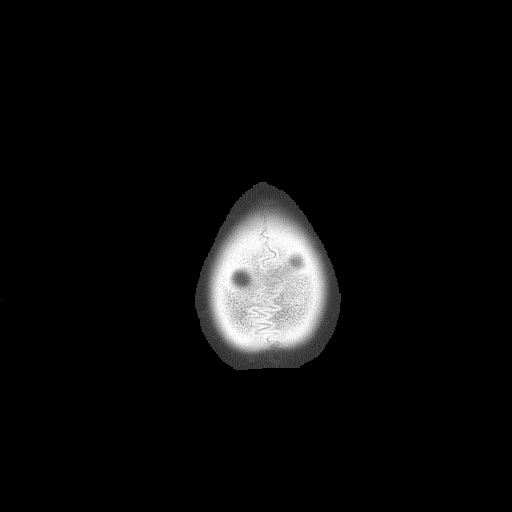

[Series 4: cor head wo · coronal · 0.35mm/px · 3 of 69 slices shown]
[im 18/69  bone]
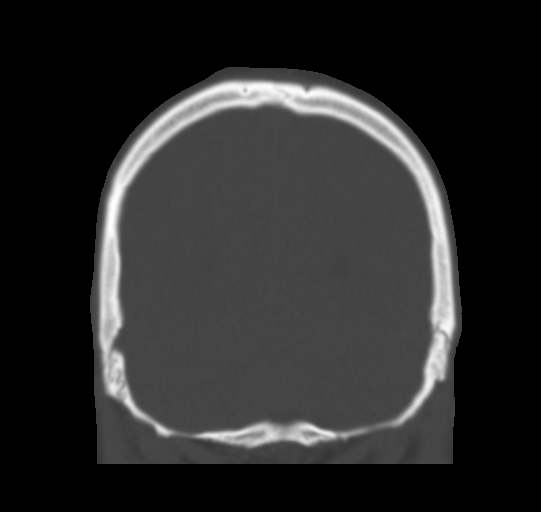
[im 35/69  bone]
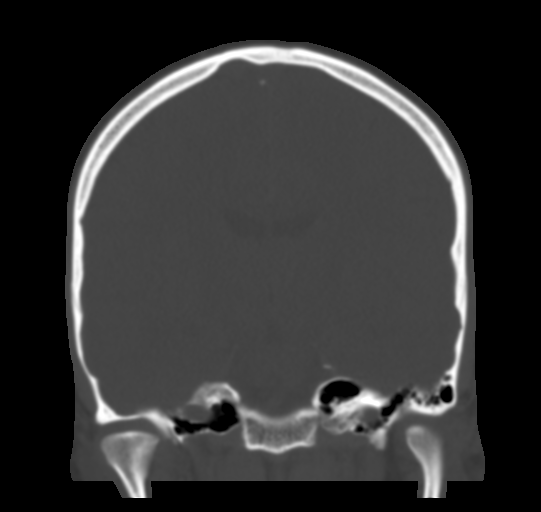
[im 52/69  bone]
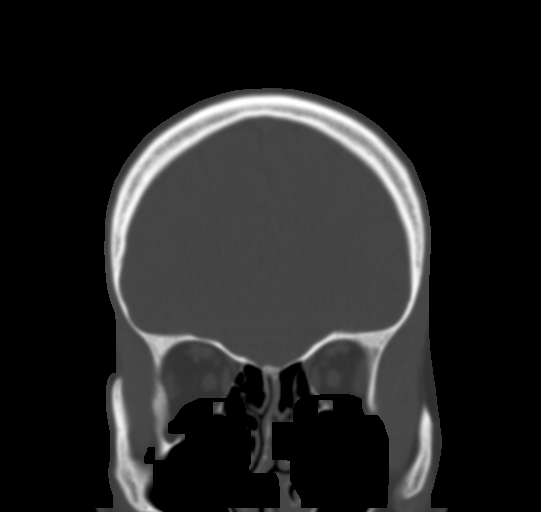

[Series 5: sag head wo · sagittal · 0.35mm/px · 1 of 54 slices shown]
[im 27/54  bone]
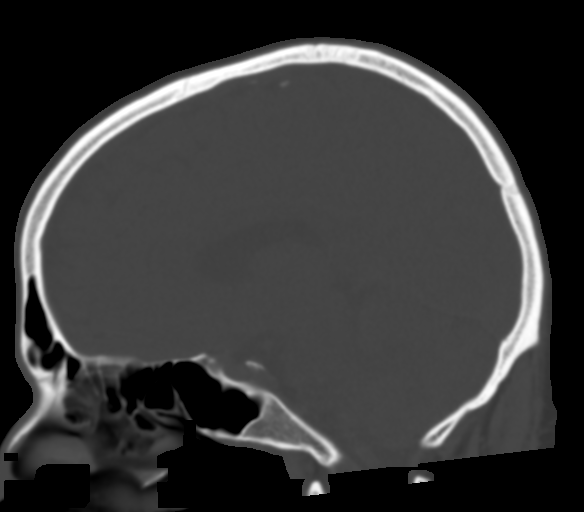

[Series 6: max soft · axial · 0.34mm/px · z∈[+716,+746]mm · 2 of 100 slices shown]
[im 8/100  brain]
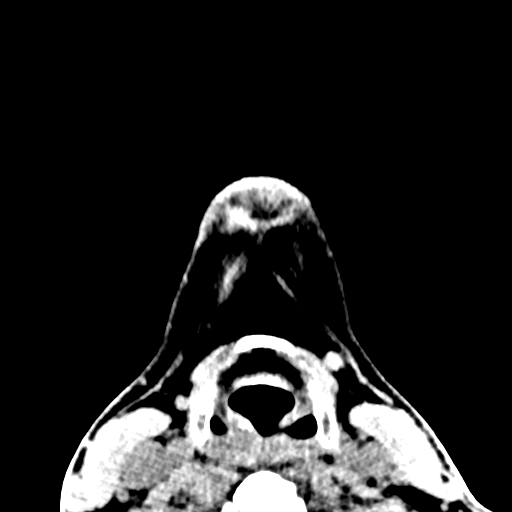
[im 23/100  brain]
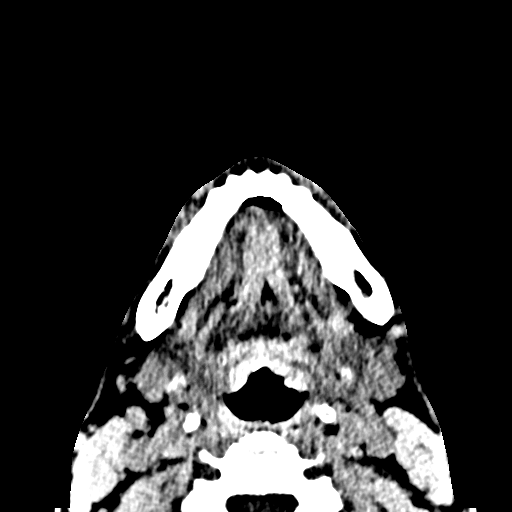

[16 of 47 positions shown; findings below may reference images not displayed]

FINDINGS: CT HEAD FINDINGS

Brain: No evidence of acute infarction, hemorrhage, hydrocephalus,
extra-axial collection or mass lesion/mass effect.

Vascular: No hyperdense vessel or unexpected calcification.

Skull: Normal. Negative for fracture or focal lesion.

Other: Soft tissue swelling over the right forehead and supraorbital
region. The globes are intact. Extracranial soft tissues otherwise
normal.

CT MAXILLOFACIAL FINDINGS

Osseous: No fracture or mandibular dislocation. No destructive
process.

Orbits: Negative. No traumatic or inflammatory finding.

Sinuses: Mucosal thickening in the left maxillary sinus. Paranasal
sinuses, mastoid air cells, and middle ears are otherwise normal.

Soft tissues: Soft tissue swelling is seen over the right forehead
and supraorbital region. The underlying globe is intact. No other
soft tissue abnormalities are identified.
IMPRESSION: 1. Soft tissue swelling over the right forehead and supraorbital
region.
2. No acute intracranial abnormalities.
3. No facial bone fractures.

## 2019-10-28 DEATH — deceased
